# Patient Record
Sex: Male | Born: 1980
Health system: Southern US, Community
[De-identification: ages and names within clinical notes are randomized; demographics above are authoritative.]

## PROBLEM LIST (undated history)

## (undated) DIAGNOSIS — Z789 Other specified health status: Secondary | ICD-10-CM

## (undated) HISTORY — DX: Other specified health status: Z78.9

## (undated) HISTORY — PX: NO PAST SURGERIES: SHX2092

---

## 2013-04-27 ENCOUNTER — Ambulatory Visit: Payer: Self-pay | Admitting: Internal Medicine

## 2017-08-31 ENCOUNTER — Emergency Department (HOSPITAL_BASED_OUTPATIENT_CLINIC_OR_DEPARTMENT_OTHER)
Admission: EM | Admit: 2017-08-31 | Discharge: 2017-08-31 | Disposition: A | Discharge status: Home / Self Care | Payer: 59 | Attending: Physician Assistant | Admitting: Physician Assistant

## 2017-08-31 ENCOUNTER — Emergency Department (HOSPITAL_BASED_OUTPATIENT_CLINIC_OR_DEPARTMENT_OTHER): Payer: 59

## 2017-08-31 ENCOUNTER — Encounter (HOSPITAL_BASED_OUTPATIENT_CLINIC_OR_DEPARTMENT_OTHER): Payer: Self-pay | Admitting: Emergency Medicine

## 2017-08-31 DIAGNOSIS — R1011 Right upper quadrant pain: Secondary | ICD-10-CM | POA: Diagnosis not present

## 2017-08-31 DIAGNOSIS — Z87891 Personal history of nicotine dependence: Secondary | ICD-10-CM | POA: Insufficient documentation

## 2017-08-31 DIAGNOSIS — R0789 Other chest pain: Secondary | ICD-10-CM | POA: Insufficient documentation

## 2017-08-31 LAB — COMPREHENSIVE METABOLIC PANEL
ALT: 22 U/L (ref 17–63)
AST: 25 U/L (ref 15–41)
Albumin: 4.7 g/dL (ref 3.5–5.0)
Alkaline Phosphatase: 49 U/L (ref 38–126)
Anion gap: 6 (ref 5–15)
BILIRUBIN TOTAL: 0.7 mg/dL (ref 0.3–1.2)
BUN: 14 mg/dL (ref 6–20)
CHLORIDE: 104 mmol/L (ref 101–111)
CO2: 28 mmol/L (ref 22–32)
Calcium: 9.4 mg/dL (ref 8.9–10.3)
Creatinine, Ser: 0.83 mg/dL (ref 0.61–1.24)
GFR calc Af Amer: >60 mL/min (ref >60)
GFR calc non Af Amer: >60 mL/min (ref >60)
GLUCOSE: 100 mg/dL — AB (ref 65–99)
POTASSIUM: 4.2 mmol/L (ref 3.5–5.1)
SODIUM: 138 mmol/L (ref 135–145)
TOTAL PROTEIN: 7.5 g/dL (ref 6.5–8.1)

## 2017-08-31 LAB — URINALYSIS, ROUTINE W REFLEX MICROSCOPIC
Bilirubin Urine: NEGATIVE
GLUCOSE, UA: NEGATIVE mg/dL
Hgb urine dipstick: NEGATIVE
Ketones, ur: NEGATIVE mg/dL
LEUKOCYTES UA: NEGATIVE
NITRITE: NEGATIVE
PH: 7 (ref 5.0–8.0)
Protein, ur: NEGATIVE mg/dL
Specific Gravity, Urine: <1.005 — ABNORMAL LOW (ref 1.005–1.030)

## 2017-08-31 LAB — CBC WITH DIFFERENTIAL/PLATELET
BASOS PCT: 0 %
Basophils Absolute: 0 10*3/uL (ref 0.0–0.1)
Eosinophils Absolute: 0 10*3/uL (ref 0.0–0.7)
Eosinophils Relative: 1 %
HEMATOCRIT: 40.9 % (ref 39.0–52.0)
Hemoglobin: 14.3 g/dL (ref 13.0–17.0)
LYMPHS ABS: 1.8 10*3/uL (ref 0.7–4.0)
LYMPHS PCT: 43 %
MCH: 27.9 pg (ref 26.0–34.0)
MCHC: 35 g/dL (ref 30.0–36.0)
MCV: 79.7 fL (ref 78.0–100.0)
MONOS PCT: 7 %
Monocytes Absolute: 0.3 10*3/uL (ref 0.1–1.0)
NEUTROS ABS: 2.1 10*3/uL (ref 1.7–7.7)
NEUTROS PCT: 49 %
Platelets: 204 10*3/uL (ref 150–400)
RBC: 5.13 MIL/uL (ref 4.22–5.81)
RDW: 13.1 % (ref 11.5–15.5)
WBC: 4.3 10*3/uL (ref 4.0–10.5)

## 2017-08-31 LAB — LIPASE, BLOOD: Lipase: 32 U/L (ref 11–51)

## 2017-08-31 MED ORDER — HYDROMORPHONE HCL 1 MG/ML IJ SOLN
0.5000 mg | Freq: Once | INTRAMUSCULAR | Status: AC
  Administered 2017-08-31: 0.5 mg via INTRAVENOUS
  Filled 2017-08-31: qty 1

## 2017-08-31 MED ORDER — IBUPROFEN 600 MG PO TABS: 600.0000 mg | ORAL_TABLET | Freq: Three times a day (TID) | ORAL | 0 refills | Status: DC | PRN

## 2017-08-31 MED ORDER — ONDANSETRON HCL 4 MG/2ML IJ SOLN
4.0000 mg | Freq: Once | INTRAMUSCULAR | Status: AC
  Administered 2017-08-31: 4 mg via INTRAVENOUS
  Filled 2017-08-31: qty 2

## 2017-08-31 NOTE — ED Triage Notes (Signed)
Pt c/o RT side pain, just under rib cage since yesterday; no injury; denies NVD

## 2017-08-31 NOTE — ED Notes (Signed)
Patient transported to Ultrasound 

## 2017-08-31 NOTE — ED Provider Notes (Signed)
[facility]   CSN: [id number] Arrival date & time: 08/31/17  0932     History   Chief Complaint Chief Complaint  Patient presents with  . Abdominal Pain    HPI Ronald Harris is a 36 y.o. male.  Patient of abdominal surgeries presents with complaint of acute onset, sharp, constant, right upper quadrant abdominal pain/lower R chest pain, worse with deep breathing, worse with movement starting approximately one day ago. Pain was mild at first. He was able to do normal activities until this morning when he tried to work out. Pain was much worse with movement and he had difficulty driving home. No associated fevers, nausea, vomiting, diarrhea. No urinary symptoms including dysuria or hematuria. No shortness of breath. Patient took ibuprofen approximately one hour ago without relief. Also drank several glasses of water this morning. Last solid intake was last night. No history of pain prior.      History reviewed. No pertinent past medical history.  There are no active problems to display for this patient.   History reviewed. No pertinent surgical history.     Home Medications    Prior to Admission medications   Not on File    Family History No family history on file.  Social History Social History  Substance Use Topics  . Smoking status: Former Games developer  . Smokeless tobacco: Never Used  . Alcohol use Yes     Comment: occ     Allergies   Patient has no known allergies.   Review of Systems Review of Systems  Constitutional: Negative for fever.  HENT: Negative for rhinorrhea and sore throat.   Eyes: Negative for redness.  Respiratory: Negative for cough and shortness of breath.   Cardiovascular: Negative for chest pain.  Gastrointestinal: Positive for abdominal pain. Negative for diarrhea, nausea and vomiting.  Genitourinary: Negative for dysuria.  Musculoskeletal: Negative for myalgias.  Skin: Negative for rash.  Neurological:  Negative for headaches.     Physical Exam Updated Vital Signs BP 128/84 (BP Location: Right Arm)   Pulse 62   Temp 98.3 F (36.8 C) (Oral)   Resp 18   Ht  (1.803 m)   Wt 76.7 kg (169 lb)   SpO2 100%   BMI 23.57 kg/m   Physical Exam  Constitutional: He appears well-developed and well-nourished.  HENT:  Head: Normocephalic and atraumatic.  Eyes: Conjunctivae are normal. Right eye exhibits no discharge. Left eye exhibits no discharge.  Neck: Normal range of motion. Neck supple.  Cardiovascular: Normal rate, regular rhythm and normal heart sounds.   Pulmonary/Chest: Effort normal and breath sounds normal. He exhibits tenderness (inferior R ribs).  Abdominal: Soft. There is tenderness (Moderate right upper quadrant pain, neg Murphy's). There is no rebound and no guarding.  Neurological: He is alert.  Skin: Skin is warm and dry.  Psychiatric: He has a normal mood and affect.  Nursing note and vitals reviewed.    ED Treatments / Results  Labs (all labs ordered are listed, but only abnormal results are displayed) Labs Reviewed  COMPREHENSIVE METABOLIC PANEL - Abnormal; Notable for the following:       Result Value   Glucose, Bld 100 (*)    All other components within normal limits  URINALYSIS, ROUTINE W REFLEX MICROSCOPIC - Abnormal; Notable for the following:    Specific Gravity, Urine <1.005 (*)    All other components within normal limits  CBC WITH DIFFERENTIAL/PLATELET  LIPASE, BLOOD    EKG  EKG  Interpretation None       Radiology Dg Chest 2 View  Result Date: 08/31/2017 CLINICAL DATA:  36 year old male with right upper quadrant pain on deep inspiration EXAM: CHEST  2 VIEW COMPARISON:  None. FINDINGS: The lungs are clear and negative for focal airspace consolidation, pulmonary edema or suspicious pulmonary nodule. No pleural effusion or pneumothorax. Cardiac and mediastinal contours are within normal limits. No acute fracture or lytic or blastic osseous  lesions. The visualized upper abdominal bowel gas pattern is unremarkable. IMPRESSION: Negative chest x-ray. Electronically Signed   By: Malachy MoanHeath  McCullough M.D.   On: 08/31/2017 11:07   [place] Abdomen Limited Ruq  Result Date: 08/31/2017 CLINICAL DATA:  Right-sided abdominal pain today. EXAM: ULTRASOUND ABDOMEN LIMITED RIGHT UPPER QUADRANT COMPARISON:  None. FINDINGS: Gallbladder: No gallstones or wall thickening visualized. No sonographic Murphy sign noted by sonographer. Common bile duct: Diameter: 2 mm. Not well seen in its entirety due to overlying bowel gas. Liver: No focal lesion identified. Within normal limits in parenchymal echogenicity. Portal vein is patent on color Doppler imaging with normal direction of blood flow towards the liver. IMPRESSION: Normal right upper quadrant ultrasound. Electronically Signed   By: Bary RichardStan  Maynard M.D.   On: 08/31/2017 10:55    Procedures Procedures (including critical care time)  Medications Ordered in ED Medications  HYDROmorphone (DILAUDID) injection 0.5 mg (0.5 mg Intravenous Given 08/31/17 1014)  ondansetron (ZOFRAN) injection 4 mg (4 mg Intravenous Given 08/31/17 1014)     Initial Impression / Assessment and Plan / ED Course  I have reviewed the triage vital signs and the nursing notes.  Pertinent labs & imaging results that were available during my care of the patient were reviewed by me and considered in my medical decision making (see chart for details).     Patient seen and examined. Work-up initiated. Medications ordered. NPO.   Vital signs reviewed and are as follows: BP 128/84 (BP Location: Right Arm)   Pulse 62   Temp 98.3 F (36.8 C) (Oral)   Resp 18   Ht 5\' 11"  (1.803 m)   Wt 76.7 kg (169 lb)   SpO2 100%   BMI 23.57 kg/m   11:56 AM patient and wife informed of results. He is feeling much better with treatment. He continues to have pain, now seems to be more localized over the inferior ribs and not as much the upper abdomen. Given  this and reassuring exam, I suspect this is likely related to chest wall pain that was worsened when he worked out this morning.  Will discharge with NSAIDs, ice/heat, rest. We discussed signs and symptoms to return including shortness of breath, worsening severe pain not controlled with ibuprofen, vomiting, fever, other concerns. Patient wife verbalized understanding and agree with plan.  Final Clinical Impressions(s) / ED Diagnoses   Final diagnoses:  RUQ abdominal pain  Right-sided chest wall pain   Patient with right upper quadrant abdominal pain and right lower rib pain. Evaluation undertaken to rule out intra-abdominal etiology and this was very reassuring. Chest x-ray does not show any pulmonary findings. Patient does not have any risk factors or abnormal vital signs to suggest PE or ACS. Given reassuring workup, will treat as chest wall pain at this point. Patient to monitor closely for home for other new or evolving symptoms. These were discussed as above.  No dangerous or life-threatening conditions suspected or identified by history, physical exam, and by work-up. No indications for hospitalization identified.    New Prescriptions New  Prescriptions   IBUPROFEN (ADVIL,MOTRIN) 600 MG TABLET    Take 1 tablet (600 mg total) by mouth every 8 (eight) hours as needed.     Renne Crigler, PA-C 08/31/17 1158    Mackuen, Cindee Salt, MD 08/31/17 1542

## 2017-08-31 NOTE — Discharge Instructions (Signed)
Please read and follow all provided instructions.  Your diagnoses today include:  1. Right-sided chest wall pain   2. RUQ abdominal pain     Tests performed today include:  Blood counts and electrolytes  Blood tests to check liver and kidney function  Blood tests to check pancreas function  Urine test to look for infection  Ultrasound of the gallbladder - does not show gallstones or other problem with gallbladder  Vital signs. See below for your results today.   Medications prescribed:   Ibuprofen (Motrin, Advil) - anti-inflammatory pain medication  Do not exceed  ibuprofen every 6 hours, take with food  You have been prescribed an anti-inflammatory medication or NSAID. Take with food. Take smallest effective dose for the shortest duration needed for your pain. Stop taking if you experience stomach pain or vomiting.   Take any prescribed medications only as directed.  Home care instructions:   Follow any educational materials contained in this packet.  Use ice/heat on sore area and use ibuprofen as directed.  Avoid any heavy lifting, pushing/pulling while you have pain.   Follow-up instructions: Please follow-up with your primary care provider in the next 3 days for further evaluation of your symptoms.    Return instructions:  SEEK IMMEDIATE MEDICAL ATTENTION IF:  The pain does not go away or becomes severe   A temperature above 101F develops   Repeated vomiting occurs (multiple episodes)   The pain becomes localized to portions of the abdomen. The right side could possibly be appendicitis. In an adult, the left lower portion of the abdomen could be colitis or diverticulitis.   Blood is being passed in stools or vomit (bright red or black tarry stools)   You develop chest pain, difficulty breathing, dizziness or fainting, or become confused, poorly responsive, or inconsolable (young children)  If you have any other emergent concerns regarding your  health  Additional Information: Abdominal (belly) pain can be caused by many things. Your caregiver performed an examination and possibly ordered blood/urine tests and imaging (CT scan, x-rays, ultrasound). Many cases can be observed and treated at home after initial evaluation in the emergency department. Even though you are being discharged home, abdominal pain can be unpredictable. Therefore, you need a repeated exam if your pain does not resolve, returns, or worsens. Most patients with abdominal pain don't have to be admitted to the hospital or have surgery, but serious problems like appendicitis and gallbladder attacks can start out as nonspecific pain. Many abdominal conditions cannot be diagnosed in one visit, so follow-up evaluations are very important.  Your vital signs today were: BP 128/84 (BP Location: Right Arm)    Pulse 62    Temp 98.3 F (36.8 C) (Oral)    Resp 18    Ht  (1.803 m)    Wt 76.7 kg (169 lb)    SpO2 100%    BMI 23.57 kg/m  If your blood pressure (bp) was elevated above 135/85 this visit, please have this repeated by your doctor within one month. --------------

## 2017-11-08 DIAGNOSIS — Z23 Encounter for immunization: Secondary | ICD-10-CM | POA: Diagnosis not present

## 2018-05-30 ENCOUNTER — Ambulatory Visit (INDEPENDENT_AMBULATORY_CARE_PROVIDER_SITE_OTHER): Payer: 59 | Admitting: Family Medicine

## 2018-05-30 ENCOUNTER — Encounter: Payer: Self-pay | Admitting: Family Medicine

## 2018-05-30 VITALS — BP 102/70 | HR 69 | Temp 97.9°F | Ht 71.0 in | Wt 157.5 lb

## 2018-05-30 DIAGNOSIS — F419 Anxiety disorder, unspecified: Secondary | ICD-10-CM

## 2018-05-30 DIAGNOSIS — R5383 Other fatigue: Secondary | ICD-10-CM | POA: Diagnosis not present

## 2018-05-30 LAB — COMPREHENSIVE METABOLIC PANEL
ALBUMIN: 4.7 g/dL (ref 3.5–5.2)
ALK PHOS: 57 U/L (ref 39–117)
ALT: 17 U/L (ref 0–53)
AST: 18 U/L (ref 0–37)
BUN: 16 mg/dL (ref 6–23)
CHLORIDE: 102 meq/L (ref 96–112)
CO2: 29 mEq/L (ref 19–32)
Calcium: 9.8 mg/dL (ref 8.4–10.5)
Creatinine, Ser: 0.86 mg/dL (ref 0.40–1.50)
GFR: 105.88 mL/min (ref >60.00)
Glucose, Bld: 93 mg/dL (ref 70–99)
POTASSIUM: 4.9 meq/L (ref 3.5–5.1)
SODIUM: 139 meq/L (ref 135–145)
TOTAL PROTEIN: 7.4 g/dL (ref 6.0–8.3)
Total Bilirubin: 0.6 mg/dL (ref 0.2–1.2)

## 2018-05-30 LAB — CBC
HEMATOCRIT: 42.8 % (ref 39.0–52.0)
HEMOGLOBIN: 14.7 g/dL (ref 13.0–17.0)
MCHC: 34.2 g/dL (ref 30.0–36.0)
MCV: 82.2 fl (ref 78.0–100.0)
Platelets: 239 10*3/uL (ref 150.0–400.0)
RBC: 5.21 Mil/uL (ref 4.22–5.81)
RDW: 13.3 % (ref 11.5–15.5)
WBC: 4.7 10*3/uL (ref 4.0–10.5)

## 2018-05-30 LAB — TSH: TSH: 1.25 u[IU]/mL (ref 0.35–4.50)

## 2018-05-30 NOTE — Progress Notes (Signed)
Pre visit review using our clinic review tool, if applicable. No additional management support is needed unless otherwise documented below in the visit note. 

## 2018-05-30 NOTE — Patient Instructions (Addendum)
Start working out again. I think this will help with your energy levels.   Please consider counseling. Contact [phone] to schedule an appointment or inquire about cost/insurance coverage.  Coping skills Choose 5 that work for you:  Take a deep breath  Count to 20  Read a book  Do a puzzle  Meditate  Bake  Sing  Knit  Garden  Pray  Go outside  Call a friend  Listen to music  Take a walk  Color  Send a note  Take a bath  Watch a movie  Be alone in a quiet place  Pet an animal  Visit a friend  Journal  Exercise  Stretch   Let us know if you need anything.

## 2018-05-30 NOTE — Progress Notes (Signed)
Chief Complaint  Patient presents with  . New Patient (Initial Visit)       New Patient Visit SUBJECTIVE: HPI: Ronald Harris is an 37 y.o.male who is being seen for establishing care.   2-3 days per week for the past mo, he has been experiencing anxiety intenseness in the morning.  This typically resolves throughout the day and he has fatigue in the evening.  The fatigue is been bothering him for the past 2 months. He used to work out, has taken a break for the past 2 mo, coincidentally.  He has lost 20 pounds over the past year.  Part of this weight loss has been due to cleaning up his diet and exercising more.  He has lost 5 pounds over the past 2 months and as noted he has lost muscle mass.  He denies any palpitations, bowel changes, skin changes, or heat/cold intolerance.  No family history of thyroid disorders.  His libido was unchanged.  He has 2 young daughters.  When 1 of them asked up, it causes him to have anxiety and agitation.   No Known Allergies  Past Medical History:  Diagnosis Date  . No known health problems    Past Surgical History:  Procedure Laterality Date  . NO PAST SURGERIES     Family History  Problem Relation Age of Onset  . Diabetes Mother   . Hypertension Father   . Hyperlipidemia Father    No Known Allergies  Takes no medications routinely.  ROS Const: Denies fevers  Psych: +anxiety   OBJECTIVE: BP 102/70 (BP Location: Left Arm, Patient Position: Sitting, Cuff Size: Normal)   Pulse 69   Temp 97.9 F (36.6 C) (Oral)   Ht 5\' 11"  (1.803 m)   Wt 157 lb 8 oz (71.4 kg)   SpO2 97%   BMI 21.97 kg/m   Constitutional: -  VS reviewed -  Well developed, well nourished, appears stated age -  No apparent distress  Psychiatric: -  Oriented to person, place, and time -  Memory intact -  Affect and mood normal -  Fluent conversation, good eye contact -  Judgment and insight age appropriate  Eye: -  Conjunctivae clear, no discharge -  Pupils  symmetric, round, reactive to light  ENMT: -  MMM    Pharynx moist, no exudate, no erythema  Neck: -  No gross swelling, no palpable masses -  Thyroid midline, not enlarged, mobile, no palpable masses  Cardiovascular: -  RRR -  No LE edema  Respiratory: -  Normal respiratory effort, no accessory muscle use, no retraction -  Breath sounds equal, no wheezes, no ronchi, no crackles  Gastrointestinal: -  Bowel sounds normal -  No tenderness, no distention, no guarding, no masses  Neurological:  -  CN II - XII grossly intact -  DTR's equal and symmetric throughout, no cerebellar signs, no clonus -  Sensation grossly intact to light touch, equal bilaterally  Musculoskeletal: -  No clubbing, no cyanosis -  Gait normal -  5/5 strength throughout  Skin: -  No significant lesion on inspection -  Warm and dry to palpation   ASSESSMENT/PLAN: Fatigue, unspecified type - Plan: CBC, Comprehensive metabolic panel, TSH  Anxiety  Could be due to physical deconditioning.  Anxiety could be contributing as well.  I think once he starts exercising again, his energy will get back to baseline.  We will check labs to rule out any metabolic contributors. Number for counseling provided in  his paperwork.  I did offer medication, however he declined at this time.  I think this is very reasonable. Patient should return at earliest convenience for a physical. The patient voiced understanding and agreement to the plan.   Jilda Roche [place], DO 05/30/18  12:23 PM

## 2018-07-11 ENCOUNTER — Encounter: Payer: Self-pay | Admitting: Family Medicine

## 2018-07-11 ENCOUNTER — Ambulatory Visit (INDEPENDENT_AMBULATORY_CARE_PROVIDER_SITE_OTHER): Payer: 59 | Admitting: Family Medicine

## 2018-07-11 VITALS — BP 102/70 | HR 56 | Temp 98.0°F | Ht 71.0 in | Wt 159.5 lb

## 2018-07-11 DIAGNOSIS — Z0000 Encounter for general adult medical examination without abnormal findings: Secondary | ICD-10-CM | POA: Diagnosis not present

## 2018-07-11 DIAGNOSIS — Z23 Encounter for immunization: Secondary | ICD-10-CM | POA: Diagnosis not present

## 2018-07-11 DIAGNOSIS — Z114 Encounter for screening for human immunodeficiency virus [HIV]: Secondary | ICD-10-CM | POA: Diagnosis not present

## 2018-07-11 LAB — LIPID PANEL
CHOL/HDL RATIO: 5
Cholesterol: 209 mg/dL — ABNORMAL HIGH (ref 0–200)
HDL: 42.3 mg/dL (ref >39.00)
LDL Cholesterol: 139 mg/dL — ABNORMAL HIGH (ref 0–99)
NONHDL: 167.15
Triglycerides: 139 mg/dL (ref 0.0–149.0)
VLDL: 27.8 mg/dL (ref 0.0–40.0)

## 2018-07-11 NOTE — Progress Notes (Signed)
Pre visit review using our clinic review tool, if applicable. No additional management support is needed unless otherwise documented below in the visit note. 

## 2018-07-11 NOTE — Progress Notes (Signed)
Chief Complaint  Patient presents with  . Annual Exam    Well Male Ronald Harris is here for a complete physical.   His last physical was >1 year ago.  Current diet: in general, a "healthy" diet.   Current exercise: 4x/week at gym, cardio and lifting Weight trend: stable Does pt snore? No.  Seat belt? Yes.    Health maintenance Tetanus- No  HIV- No  Past Medical History:  Diagnosis Date  . No known health problems      Past Surgical History:  Procedure Laterality Date  . NO PAST SURGERIES      Medications  Takes no meds routinely.   Allergies No Known Allergies  Family History Family History  Problem Relation Age of Onset  . Diabetes Mother   . Hypertension Father   . Hyperlipidemia Father     Review of Systems: Constitutional: no fevers or chills Eye:  no recent significant change in vision Ear/Nose/Mouth/Throat:  Ears:  no tinnitus or hearing loss Nose/Mouth/Throat:  no complaints of nasal congestion, no sore throat Cardiovascular:  no chest pain, no palpitations Respiratory:  no cough and no shortness of breath Gastrointestinal:  no abdominal pain, no change in bowel habits GU:  Male: negative for dysuria, frequency, and incontinence and negative for prostate symptoms Musculoskeletal/Extremities:  no pain, redness, or swelling of the joints Integumentary (Skin/Breast):  no abnormal skin lesions reported Neurologic:  no headaches, no numbness, tingling Endocrine: No unexpected weight changes Hematologic/Lymphatic:  no night sweats  Exam BP 102/70 (BP Location: Left Arm, Patient Position: Sitting, Cuff Size: Normal)   Pulse (!) 56   Temp 98 F (36.7 C) (Oral)   Ht 5\' 11"  (1.803 m)   Wt 159 lb 8 oz (72.3 kg)   SpO2 99%   BMI 22.25 kg/m  General:  well developed, well nourished, in no apparent distress Skin:  no significant moles, warts, or growths Head:  no masses, lesions, or tenderness Eyes:  pupils equal and round, sclera anicteric without  injection Ears:  canals without lesions, TMs shiny without retraction, no obvious effusion, no erythema Nose:  nares patent, septum midline, mucosa normal Throat/Pharynx:  lips and gingiva without lesion; tongue and uvula midline; non-inflamed pharynx; no exudates or postnasal drainage Neck: neck supple without adenopathy, thyromegaly, or masses Lungs:  clear to auscultation, breath sounds equal bilaterally, no respiratory distress Cardio:  regular rate and rhythm, no bruits, no LE edema Abdomen:  abdomen soft, nontender; bowel sounds normal; no masses or organomegaly Genital (male): Declined Rectal: Deferred Musculoskeletal:  symmetrical muscle groups noted without atrophy or deformity Extremities:  no clubbing, cyanosis, or edema, no deformities, no skin discoloration Neuro:  gait normal; deep tendon reflexes normal and symmetric Psych: well oriented with normal range of affect and appropriate judgment/insight  Assessment and Plan  Well adult exam - Plan: Lipid panel  Screening for HIV (human immunodeficiency virus) - Plan: HIV antibody   Well 37 y.o. male. Counseled on diet and exercise. Tdap today also.  Other orders as above. Follow up in 1 year pending the above workup. The patient voiced understanding and agreement to the plan.  Ronald Rocheicholas Paul PenndelWendling, DO 07/11/18 8:39 AM

## 2018-07-11 NOTE — Patient Instructions (Addendum)
OK to continue Metamucil to help with constipation. Stay well hydrated while using this. Eating fruits and veggies can help with this also.   Stay physically active and keep the diet clean.  1-2 business days to get the results of your labs back.  Let us know if you need anything.

## 2018-07-11 NOTE — Addendum Note (Signed)
Addended by: Scharlene GlossEWING, ROBIN B on: 07/11/2018 09:05 AM   Modules accepted: Orders

## 2018-07-12 LAB — HIV ANTIBODY (ROUTINE TESTING W REFLEX): HIV 1&2 Ab, 4th Generation: NONREACTIVE

## 2018-07-26 IMAGING — US US ABDOMEN LIMITED
1 series · 14 of 25 positions shown · non-contrast
Comparison: None.

CLINICAL DATA: Right-sided abdominal pain today.

EXAM:
ULTRASOUND ABDOMEN LIMITED RIGHT UPPER QUADRANT

[Series 1: us abdomen limited · 0.18mm/px · 14 of 31 slices shown]
[im 1/31]
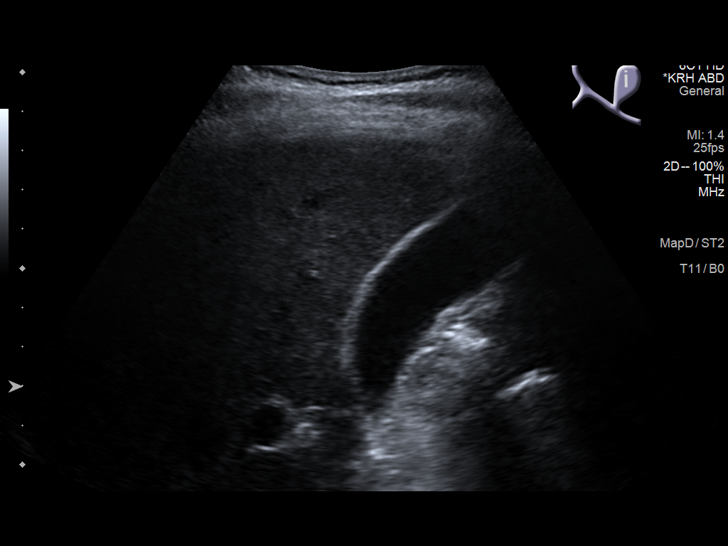
[im 3/31]
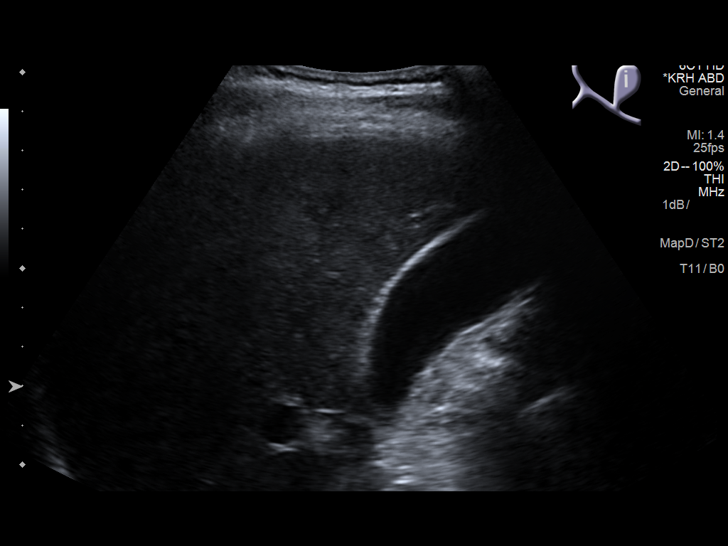
[im 6/31]
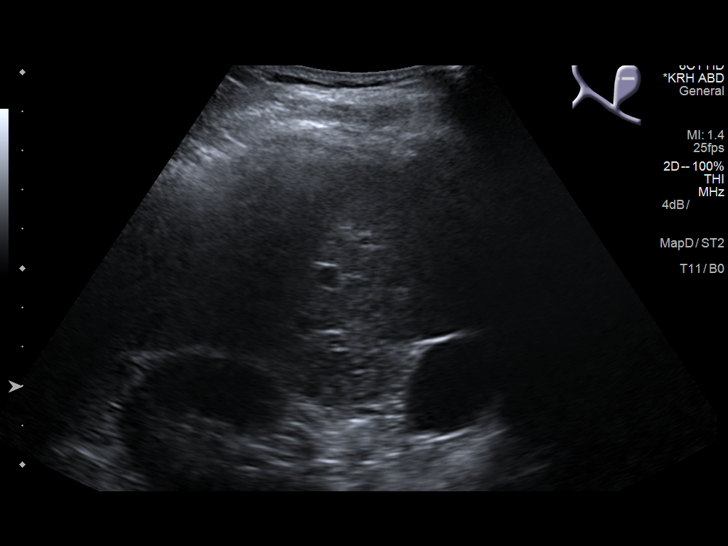
[im 8/31]
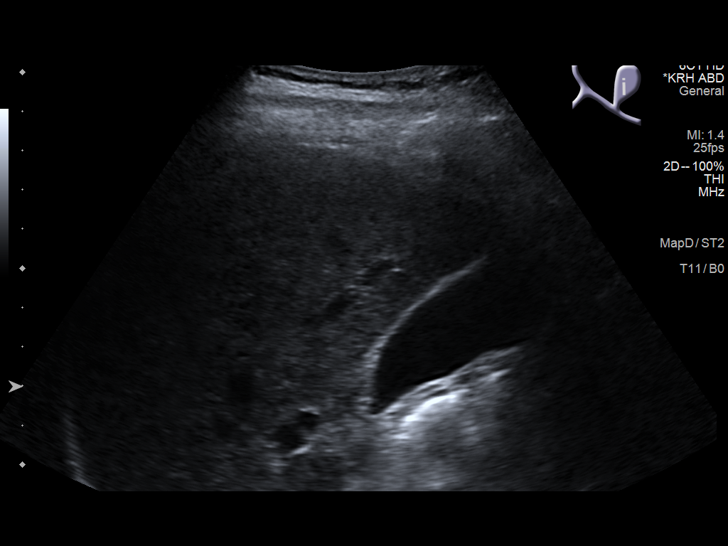
[im 11/31]
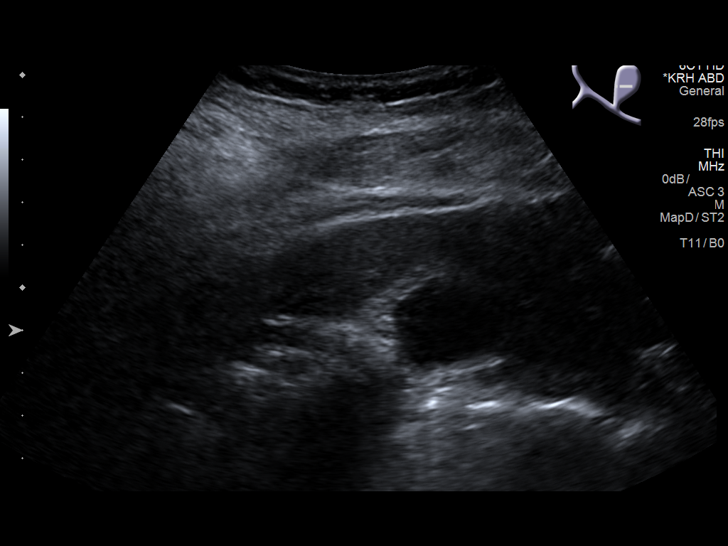
[im 12/31]
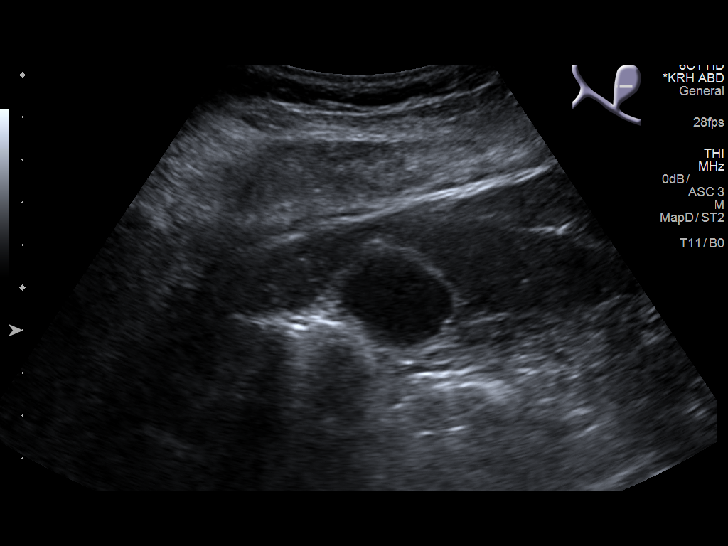
[im 14/31]
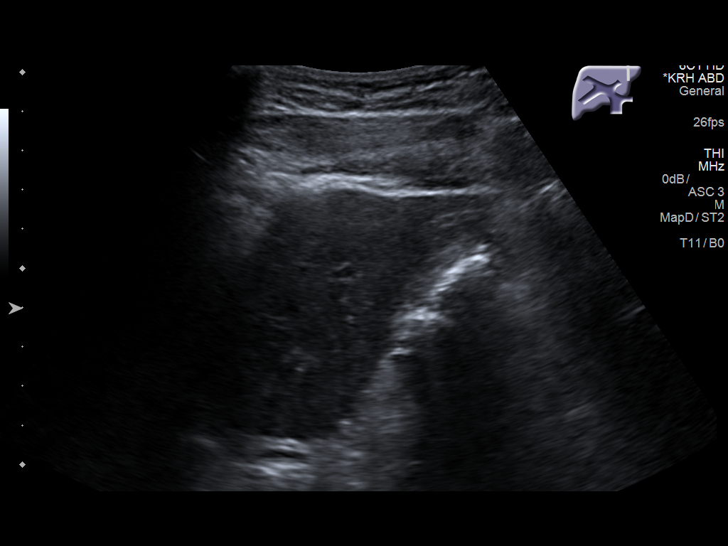
[im 17/31]
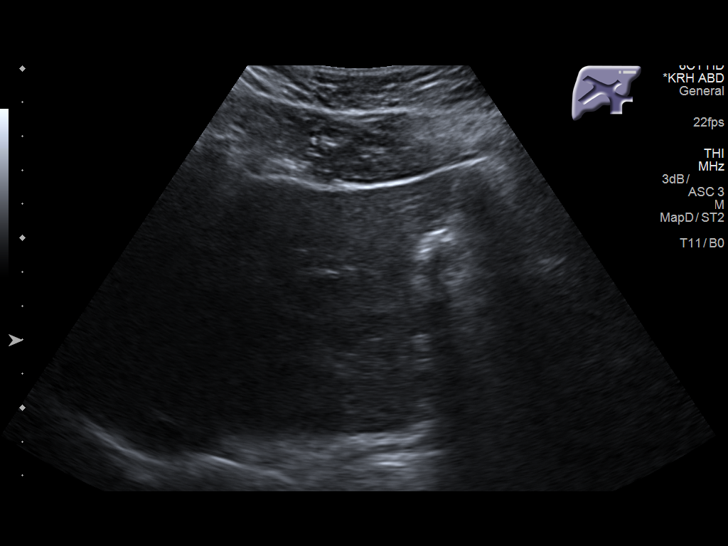
[im 19/31]
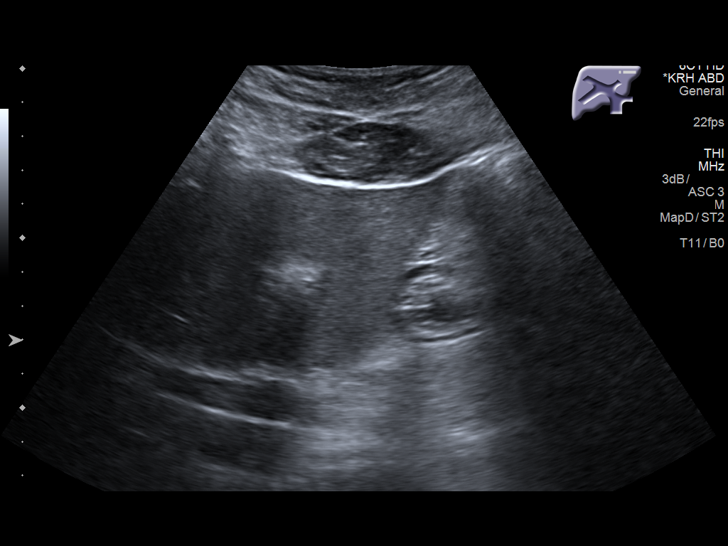
[im 21/31]
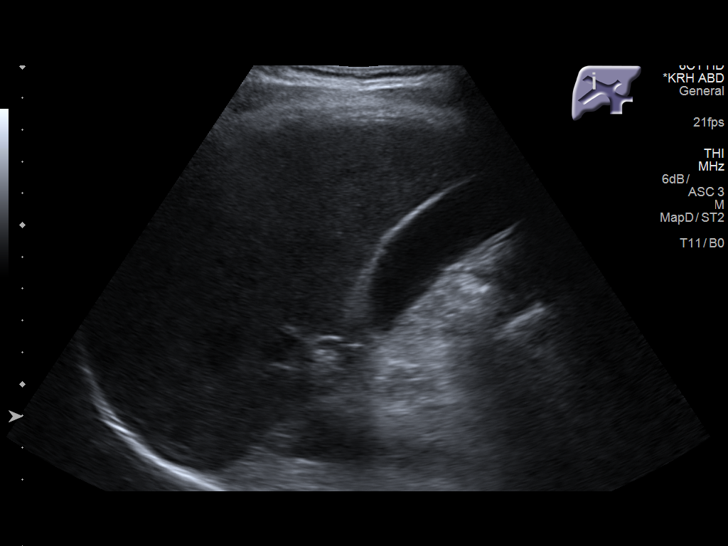
[im 23/31]
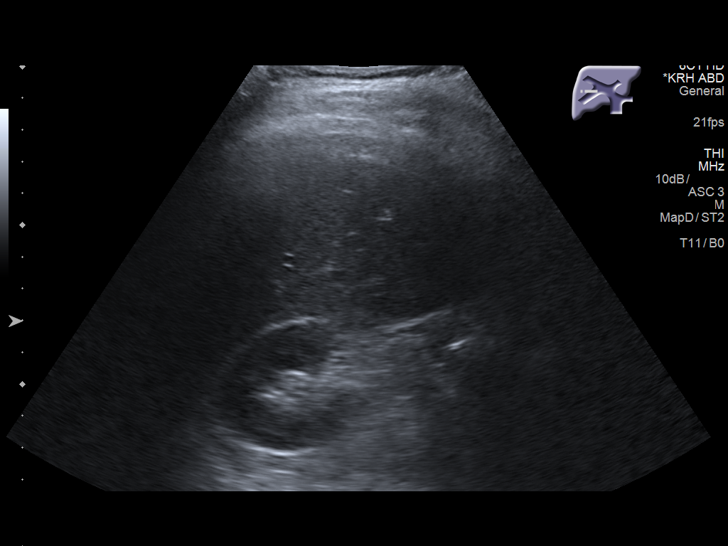
[im 26/31]
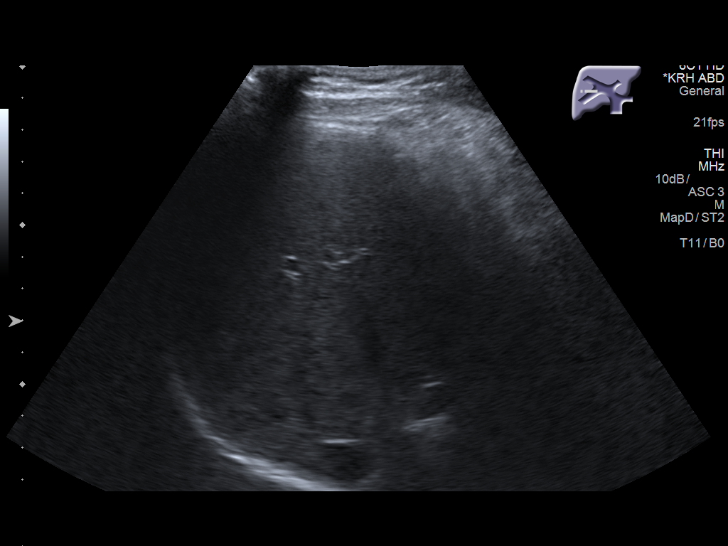
[im 28/31]
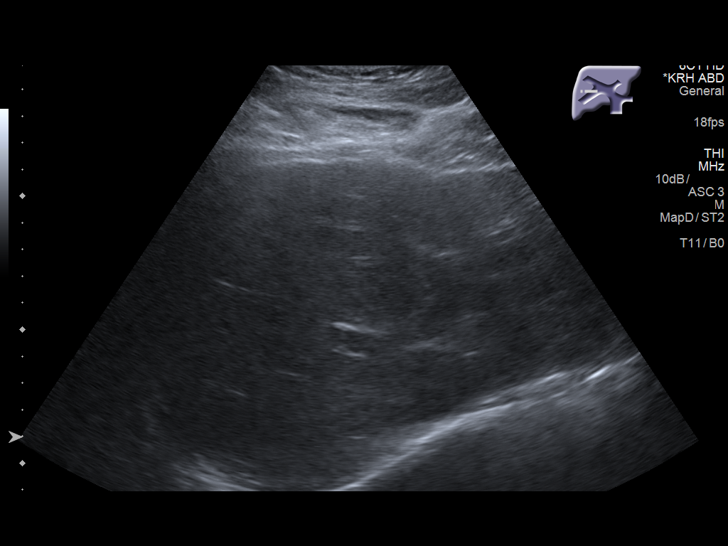
[im 31/31]
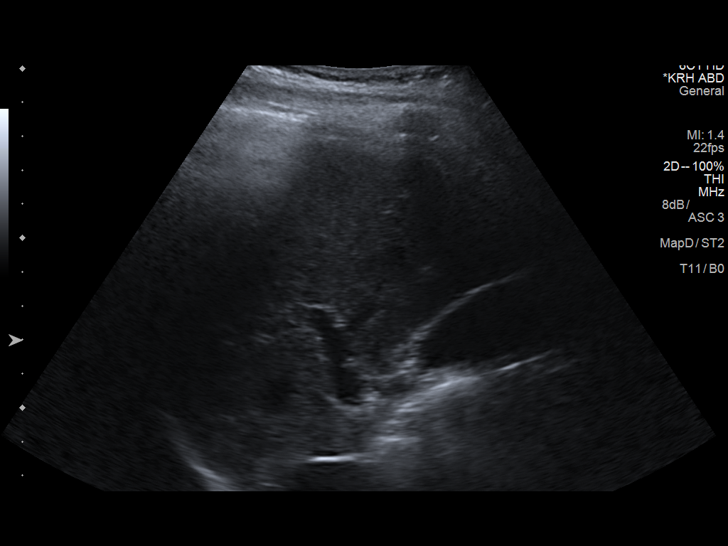

[14 of 25 positions shown; findings below may reference images not displayed]

FINDINGS: Gallbladder:

No gallstones or wall thickening visualized. No sonographic Murphy
sign noted by sonographer.

Common bile duct:

Diameter: 2 mm. Not well seen in its entirety due to overlying bowel
gas.

Liver:

No focal lesion identified. Within normal limits in parenchymal
echogenicity. Portal vein is patent on color Doppler imaging with
normal direction of blood flow towards the liver.
IMPRESSION: Normal right upper quadrant ultrasound.

## 2018-08-22 IMAGING — CR DG CHEST 2V
2 series · 2 of 2 positions shown · non-contrast
Comparison: None.

CLINICAL DATA: 36-year-old male with right upper quadrant pain on
deep inspiration

EXAM:
CHEST  2 VIEW

[w chest pa]
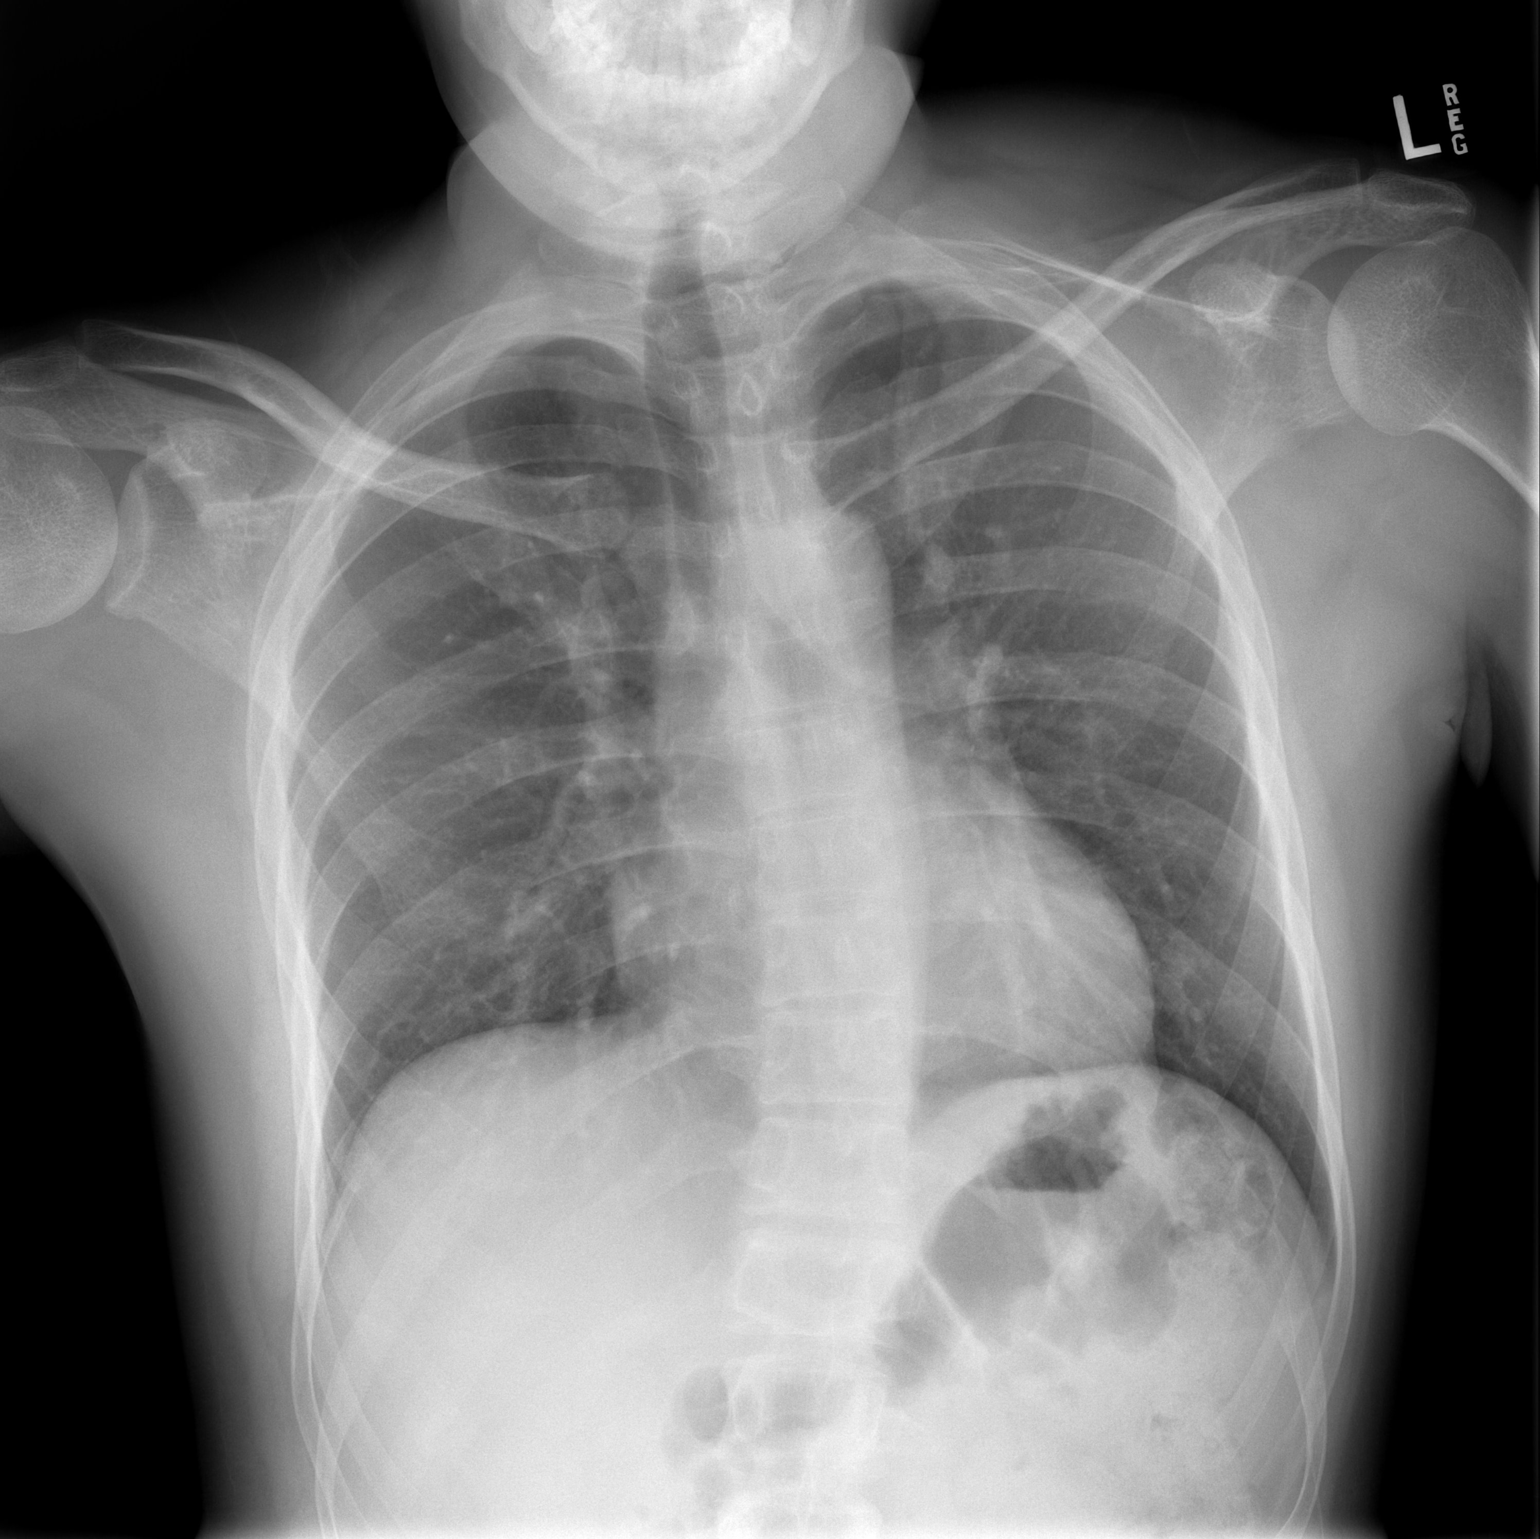

[w chest lat]
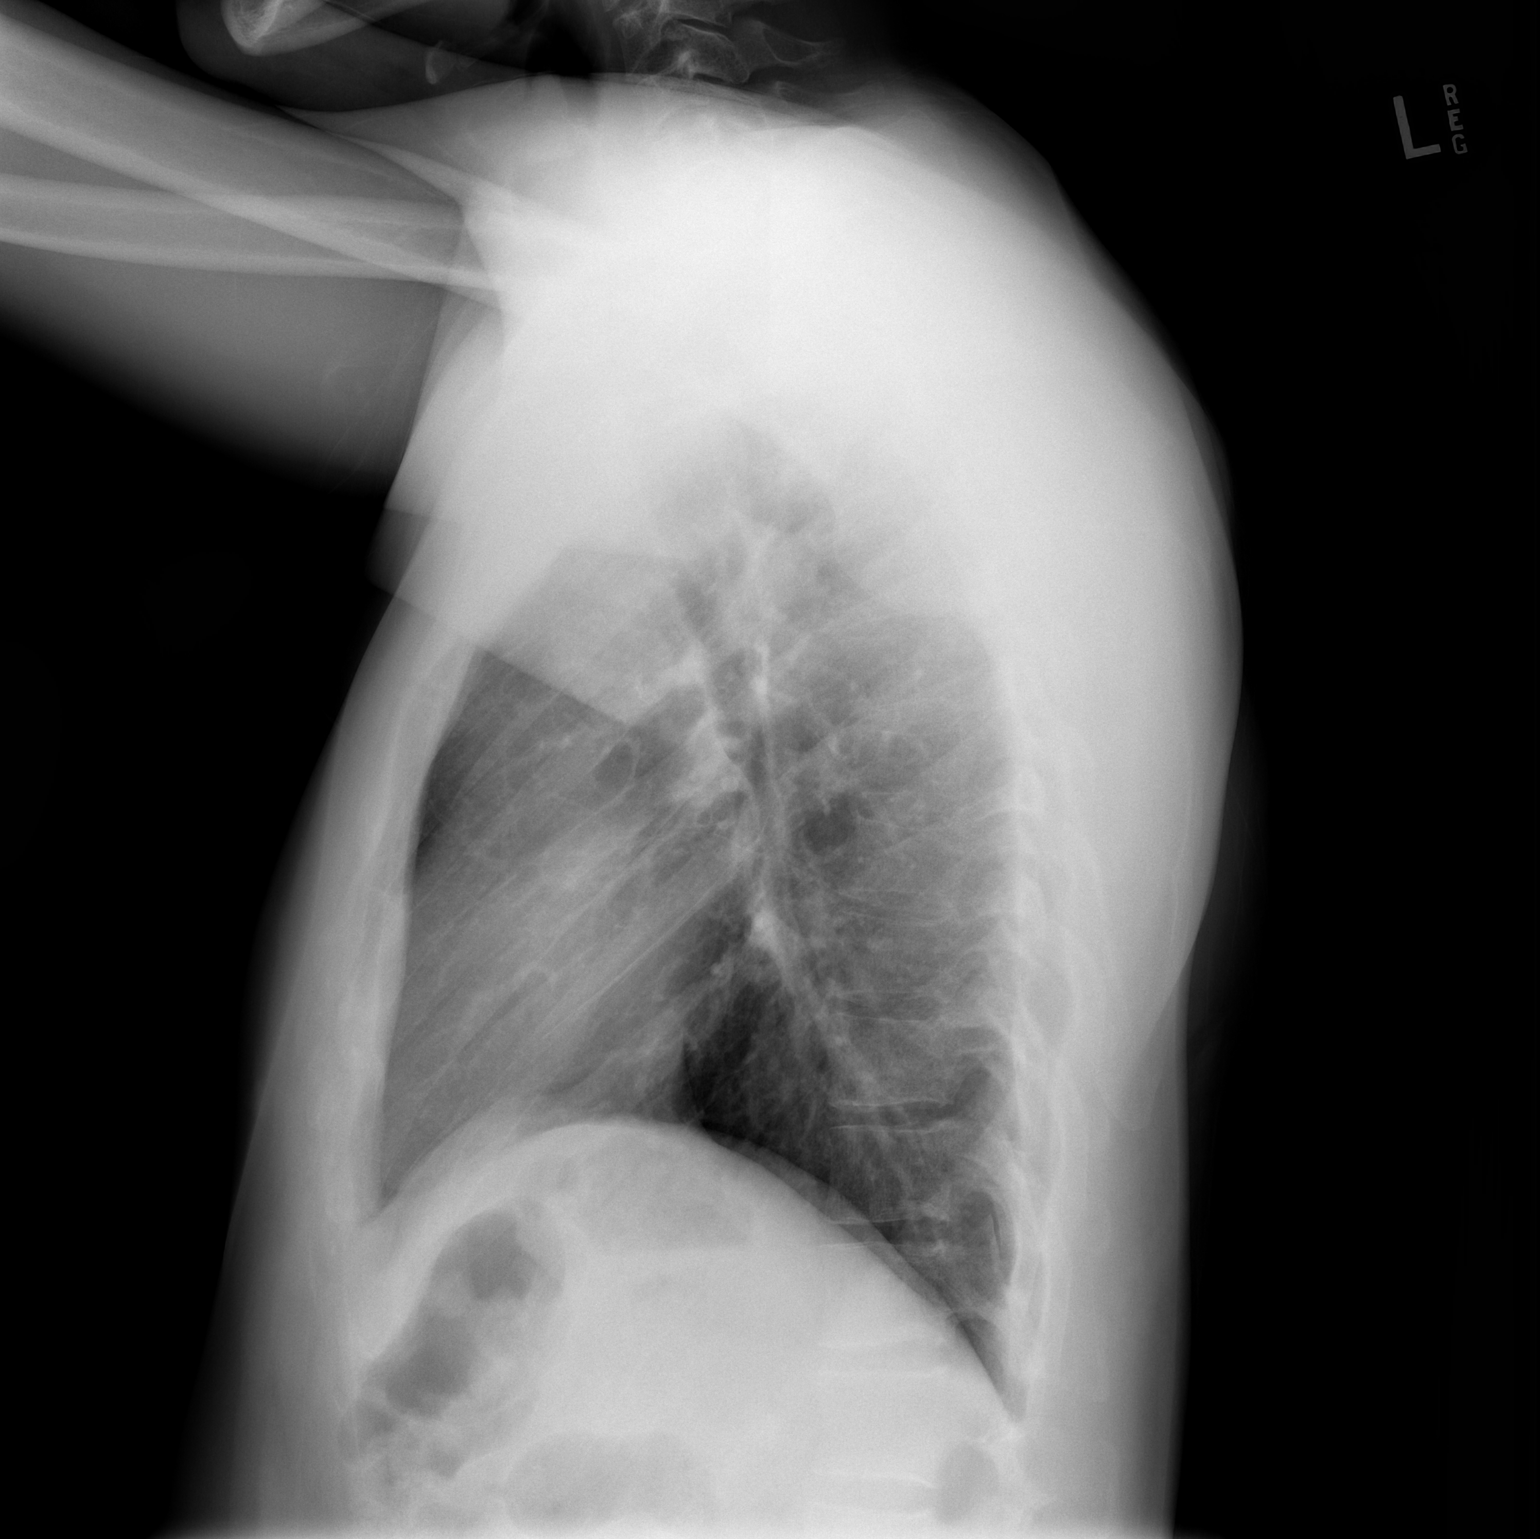

[2 of 2 positions shown; findings below may reference images not displayed]

FINDINGS: The lungs are clear and negative for focal airspace consolidation,
pulmonary edema or suspicious pulmonary nodule. No pleural effusion
or pneumothorax. Cardiac and mediastinal contours are within normal
limits. No acute fracture or lytic or blastic osseous lesions. The
visualized upper abdominal bowel gas pattern is unremarkable.
IMPRESSION: Negative chest x-ray.

## 2019-07-15 ENCOUNTER — Encounter: Payer: 59 | Admitting: Family Medicine

## 2019-07-15 DIAGNOSIS — Z0289 Encounter for other administrative examinations: Secondary | ICD-10-CM

## 2019-12-04 ENCOUNTER — Telehealth: Payer: Self-pay

## 2019-12-04 NOTE — Telephone Encounter (Signed)
Left message on patient's voice mail letting him know the bad debt and the No Show Fee has been adjusted.  Copied from [place] [phone]. Topic: General - Other >> Dec 01, 2019  5:10 PM Greggory Keen D wrote: Reason for CRM: pt called saying he received a bill for a no show fee.  He had an appt on 07/15/19 for a CPE and pt states that he called several days prior to the appt and cancelled.   He does not think he should have been charged for a no show. >> Dec 03, 2019 10:52 AM Dorna Bloom I wrote: Please email charge correction for this one as well. You can do all of them in one email
